# Patient Record
Sex: Female | Born: 1940 | Race: Black or African American | Hispanic: No | Marital: Married | Smoking: Never smoker
Health system: Southern US, Community
[De-identification: ages and names within clinical notes are randomized; demographics above are authoritative.]

## PROBLEM LIST (undated history)

## (undated) DIAGNOSIS — I10 Essential (primary) hypertension: Secondary | ICD-10-CM

## (undated) DIAGNOSIS — E119 Type 2 diabetes mellitus without complications: Secondary | ICD-10-CM

## (undated) DIAGNOSIS — E7800 Pure hypercholesterolemia, unspecified: Secondary | ICD-10-CM

---

## 2018-11-07 ENCOUNTER — Encounter (HOSPITAL_COMMUNITY): Payer: Self-pay | Admitting: Emergency Medicine

## 2018-11-07 ENCOUNTER — Other Ambulatory Visit: Payer: Self-pay

## 2018-11-07 ENCOUNTER — Ambulatory Visit (INDEPENDENT_AMBULATORY_CARE_PROVIDER_SITE_OTHER)
Admission: EM | Admit: 2018-11-07 | Discharge: 2018-11-07 | Disposition: A | Discharge status: Home / Self Care | Payer: Medicare Other | Source: Home / Self Care

## 2018-11-07 ENCOUNTER — Encounter (HOSPITAL_COMMUNITY): Payer: Self-pay | Admitting: Family Medicine

## 2018-11-07 ENCOUNTER — Emergency Department (HOSPITAL_COMMUNITY)
Admission: EM | Admit: 2018-11-07 | Discharge: 2018-11-07 | Disposition: A | Discharge status: Home / Self Care | Payer: Medicare Other | Attending: Emergency Medicine | Admitting: Emergency Medicine

## 2018-11-07 ENCOUNTER — Emergency Department (HOSPITAL_COMMUNITY): Payer: Medicare Other

## 2018-11-07 DIAGNOSIS — R0789 Other chest pain: Secondary | ICD-10-CM | POA: Insufficient documentation

## 2018-11-07 DIAGNOSIS — E119 Type 2 diabetes mellitus without complications: Secondary | ICD-10-CM | POA: Diagnosis not present

## 2018-11-07 DIAGNOSIS — I10 Essential (primary) hypertension: Secondary | ICD-10-CM | POA: Diagnosis not present

## 2018-11-07 DIAGNOSIS — R6889 Other general symptoms and signs: Secondary | ICD-10-CM | POA: Diagnosis not present

## 2018-11-07 DIAGNOSIS — Z7984 Long term (current) use of oral hypoglycemic drugs: Secondary | ICD-10-CM | POA: Insufficient documentation

## 2018-11-07 DIAGNOSIS — R05 Cough: Secondary | ICD-10-CM | POA: Diagnosis not present

## 2018-11-07 DIAGNOSIS — R0602 Shortness of breath: Secondary | ICD-10-CM | POA: Insufficient documentation

## 2018-11-07 DIAGNOSIS — R059 Cough, unspecified: Secondary | ICD-10-CM

## 2018-11-07 DIAGNOSIS — R9431 Abnormal electrocardiogram [ECG] [EKG]: Secondary | ICD-10-CM | POA: Insufficient documentation

## 2018-11-07 HISTORY — DX: Pure hypercholesterolemia, unspecified: E78.00

## 2018-11-07 HISTORY — DX: Type 2 diabetes mellitus without complications: E11.9

## 2018-11-07 HISTORY — DX: Essential (primary) hypertension: I10

## 2018-11-07 LAB — CBC WITH DIFFERENTIAL/PLATELET
Abs Immature Granulocytes: 0.03 10*3/uL (ref 0.00–0.07)
Basophils Absolute: 0 10*3/uL (ref 0.0–0.1)
Basophils Relative: 0 %
Eosinophils Absolute: 0 10*3/uL (ref 0.0–0.5)
Eosinophils Relative: 1 %
HCT: 34.2 % — ABNORMAL LOW (ref 36.0–46.0)
Hemoglobin: 11 g/dL — ABNORMAL LOW (ref 12.0–15.0)
Immature Granulocytes: 1 %
Lymphocytes Relative: 13 %
Lymphs Abs: 0.8 10*3/uL (ref 0.7–4.0)
MCH: 27.9 pg (ref 26.0–34.0)
MCHC: 32.2 g/dL (ref 30.0–36.0)
MCV: 86.8 fL (ref 80.0–100.0)
MONO ABS: 0.6 10*3/uL (ref 0.1–1.0)
Monocytes Relative: 10 %
NEUTROS ABS: 4.4 10*3/uL (ref 1.7–7.7)
Neutrophils Relative %: 75 %
Platelets: 196 10*3/uL (ref 150–400)
RBC: 3.94 MIL/uL (ref 3.87–5.11)
RDW: 13.3 % (ref 11.5–15.5)
WBC: 5.8 10*3/uL (ref 4.0–10.5)
nRBC: 0 % (ref 0.0–0.2)

## 2018-11-07 LAB — BASIC METABOLIC PANEL
Anion gap: 7 (ref 5–15)
BUN: 21 mg/dL (ref 8–23)
CO2: 17 mmol/L — ABNORMAL LOW (ref 22–32)
Calcium: 8.8 mg/dL — ABNORMAL LOW (ref 8.9–10.3)
Chloride: 112 mmol/L — ABNORMAL HIGH (ref 98–111)
Creatinine, Ser: 1.39 mg/dL — ABNORMAL HIGH (ref 0.44–1.00)
GFR calc Af Amer: 42 mL/min — ABNORMAL LOW (ref >60)
GFR, EST NON AFRICAN AMERICAN: 36 mL/min — AB (ref >60)
Glucose, Bld: 99 mg/dL (ref 70–99)
Potassium: 4.1 mmol/L (ref 3.5–5.1)
Sodium: 136 mmol/L (ref 135–145)

## 2018-11-07 LAB — I-STAT TROPONIN, ED: Troponin i, poc: 0.01 ng/mL (ref 0.00–0.08)

## 2018-11-07 MED ORDER — BENZONATATE 100 MG PO CAPS: 100.0000 mg | ORAL_CAPSULE | Freq: Three times a day (TID) | ORAL | 0 refills | Status: DC

## 2018-11-07 MED ORDER — BENZONATATE 100 MG PO CAPS: 100.0000 mg | ORAL_CAPSULE | Freq: Once | ORAL | Status: DC

## 2018-11-07 NOTE — ED Triage Notes (Signed)
Pt reports she was sent over from the [facility]. Pt reports flu-like symptoms since Sunday. Pt afebrile, denies fevers at home. Pt reports cough, chills, and body aches. Pt reports her daughter recently had a cold, denies recent travel.

## 2018-11-07 NOTE — ED Triage Notes (Signed)
C/O deep, dry cough started Sunday.  Patient then developed dizziness.   No dizziness in parking lot or lobby when walking, but legs felt week.  Patient has upper chest soreness with cough

## 2018-11-07 NOTE — ED Provider Notes (Signed)
[facility]    CSN: [id number] Arrival date & time: 11/07/18  1521     History   Chief Complaint Chief Complaint  Patient presents with  . Dizziness  . Cough    HPI Donna Torres is a 78 y.o. female.   This is the initial visit for this 78 year old woman who presents with dizziness. C/O deep, dry cough started Sunday.  Patient then developed dizziness. No dizziness in parking lot or lobby when walking, but legs felt week.  Patient has upper chest soreness with cough. Reports decreased intake of food since yesterday due to loss of appetite but no decrease in fluid intake.      Patient's past medical history is significant for hypertension, chronic kidney disease, and diabetes. She states that she typically checks her blood sugar 1 to 2 times a week (reports that it runs in the 100- 120) but has not checked it since this started.      Past Medical History:  Diagnosis Date  . Diabetes mellitus without complication (HCC)   . High cholesterol   . Hypertension     There are no active problems to display for this patient.   History reviewed. No pertinent surgical history.  OB History   No obstetric history on file.      Home Medications    Prior to Admission medications   Medication Sig Start Date End Date Taking? Authorizing Provider  glipiZIDE (GLUCOTROL) 5 MG tablet Take by mouth daily before breakfast.   Yes [provider]  labetalol (NORMODYNE) 200 MG tablet Take 200 mg by mouth 2 (two) times daily.   Yes [provider]  losartan (COZAAR) 100 MG tablet Take 100 mg by mouth daily.   Yes [provider]  rosuvastatin (CRESTOR) 40 MG tablet Take 40 mg by mouth daily.   Yes [provider]  spironolactone-hydrochlorothiazide (ALDACTAZIDE) 50-50 MG tablet Take 1 tablet by mouth daily.   Yes [provider]    Family History History reviewed. No pertinent family history.  Social History Social History    Tobacco Use  . Smoking status: Never Smoker  Substance Use Topics  . Alcohol use: Never    Frequency: Never  . Drug use: Never     Allergies   Patient has no known allergies.   Review of Systems Review of Systems   Physical Exam Triage Vital Signs ED Triage Vitals  Enc Vitals Group     BP      Pulse      Resp      Temp      Temp src      SpO2      Weight      Height      Head Circumference      Peak Flow      Pain Score      Pain Loc      Pain Edu?      Excl. in GC?    No data found.  Updated Vital Signs BP 132/60 (BP Location: Right Arm)   Pulse 78   Temp 99.8 F (37.7 C) (Oral)   Resp 18   SpO2 96%    Physical Exam Vitals signs and nursing note reviewed.  Constitutional:      Appearance: She is ill-appearing.  HENT:     Head: Normocephalic.     Right Ear: There is impacted cerumen.     Left Ear: Tympanic membrane normal.     Nose: Nose  normal.     Mouth/Throat:     Mouth: Mucous membranes are moist.     Pharynx: No oropharyngeal exudate or posterior oropharyngeal erythema.  Eyes:     Conjunctiva/sclera: Conjunctivae normal.     Pupils: Pupils are equal, round, and reactive to light.  Neck:     Musculoskeletal: Neck supple.  Cardiovascular:     Rate and Rhythm: Normal rate and regular rhythm.     Pulses: Normal pulses.     Heart sounds: Normal heart sounds.  Pulmonary:     Effort: Respiratory distress present.     Breath sounds: Rhonchi present.  Abdominal:     Palpations: Abdomen is soft.  Skin:    General: Skin is warm and dry.  Neurological:     General: No focal deficit present.     Mental Status: She is alert and oriented to person, place, and time.     Sensory: Sensation is intact.     Motor: Motor function is intact.     Coordination: Coordination is intact.     Gait: Gait abnormal.     Comments: Ambulated patient ~ 6 feet with pulse ox on finger and patient became dizzy and had to grab onto counter.   Psychiatric:         Mood and Affect: Mood normal.        Behavior: Behavior normal.        Thought Content: Thought content normal.        Judgment: Judgment normal.      UC Treatments / Results  Labs (all labs ordered are listed, but only abnormal results are displayed) Labs Reviewed  POCT INFLUENZA A/B  CBG MONITORING, ED    EKG None  Radiology No results found.  Procedures Procedures (including critical care time)  Medications Ordered in UC Medications - No data to display  Initial Impression / Assessment and Plan / UC Course  I have reviewed the triage vital signs and the nursing notes.  Pertinent labs & imaging results that were available during my care of the patient were reviewed by me and considered in my medical decision making (see chart for details).    Final Clinical Impressions(s) / UC Diagnoses   Final diagnoses:  None   Discharge Instructions   None    ED Prescriptions    None     Controlled Substance Prescriptions  Controlled Substance Registry consulted? Not Applicable   Elvina Sidle, MD 11/07/18 [phone]

## 2018-11-07 NOTE — Discharge Instructions (Signed)
There are several possible explanations for your acute illness.  We need to find out what is causing this and we do not have all the testing necessary here at the urgent care.  You need to go immediately to the emergency room and have them evaluate you.

## 2018-11-07 NOTE — Discharge Instructions (Signed)
Please read and follow all provided instructions.  Your diagnoses today include:  1. Cough     Tests performed today include:  Chest x-ray - does not show any pneumonia  Blood counts and electrolytes - slightly low red blood cell count and slightly low kidney function, please have your doctor monitor your levels  Vital signs. See below for your results today.   Medications prescribed:   Tessalon Perles - cough suppressant medication  Take any prescribed medications only as directed.  Home care instructions:  Follow any educational materials contained in this packet.  Follow-up instructions: Please follow-up with your primary care provider in the next 3 days for further evaluation of your symptoms and a recheck if you are not feeling better.   Return instructions:   Please return to the Emergency Department if you experience worsening symptoms.  Please return with worsening wheezing, shortness of breath, or difficulty breathing.  Return with persistent fever above 101F.   Please return if you have any other emergent concerns.  Additional Information:  Your vital signs today were: BP 133/80    Pulse 76    Temp 99.3 F (37.4 C) (Oral) Comment: Simultaneous filing. User may not have seen previous data. Comment (Src): Simultaneous filing. User may not have seen previous data.   Resp 15    Ht 5\' 6"  (1.676 m)    Wt 82.1 kg    SpO2 97%    BMI 29.21 kg/m  If your blood pressure (BP) was elevated above 135/85 this visit, please have this repeated by your doctor within one month. --------------

## 2018-11-07 NOTE — ED Provider Notes (Signed)
[facility] EMERGENCY DEPARTMENT Provider Note   CSN: [id number] Arrival date & time: 11/07/18  1704    History   Chief Complaint Chief Complaint  Patient presents with  . Influenza    HPI Donna Torres is a 78 y.o. female.     Patient with history of diabetes, high cholesterol, hypertension presents the emergency department from urgent care with complaint of cough, chest pain, shortness of breath with exertion.  Patient states that she suddenly developed symptoms on Sunday (today is Wednesday).  She reports a deep, nonproductive cough with pain in her chest when she coughs.  She has not had any documented fevers at home.  No ear pain, runny nose, or sore throat.  No body aches or chills.  She feels more short of breath when she walks up a flight of stairs or is more active.  She denies any nausea, vomiting, or diarrhea.  No diaphoresis, lightheadedness or presyncope.  No lower extremity swelling.  No recent travel to high risk areas for COVID-19 or any positive contacts to those with a virus.     Past Medical History:  Diagnosis Date  . Diabetes mellitus without complication (HCC)   . High cholesterol   . Hypertension     There are no active problems to display for this patient.   No past surgical history on file.   OB History   No obstetric history on file.      Home Medications    Prior to Admission medications   Medication Sig Start Date End Date Taking? Authorizing Provider  glipiZIDE (GLUCOTROL) 5 MG tablet Take by mouth daily before breakfast.    [provider]  labetalol (NORMODYNE) 200 MG tablet Take 200 mg by mouth 2 (two) times daily.    [provider]  losartan (COZAAR) 100 MG tablet Take 100 mg by mouth daily.    [provider]  rosuvastatin (CRESTOR) 40 MG tablet Take 40 mg by mouth daily.    [provider]  spironolactone-hydrochlorothiazide (ALDACTAZIDE) 50-50 MG tablet Take 1 tablet by mouth  daily.    [provider]    Family History No family history on file.  Social History Social History   Tobacco Use  . Smoking status: Never Smoker  . Smokeless tobacco: Never Used  Substance Use Topics  . Alcohol use: Never    Frequency: Never  . Drug use: Never     Allergies   Patient has no known allergies.   Review of Systems Review of Systems  Constitutional: Negative for fever.  HENT: Negative for rhinorrhea and sore throat.   Eyes: Negative for redness.  Respiratory: Positive for cough and shortness of breath. Negative for wheezing.   Cardiovascular: Positive for chest pain.  Gastrointestinal: Negative for abdominal pain, diarrhea, nausea and vomiting.  Genitourinary: Negative for dysuria.  Musculoskeletal: Negative for myalgias.  Skin: Negative for rash.  Neurological: Negative for headaches.     Physical Exam Updated Vital Signs BP (!) 143/65 (BP Location: Right Arm)   Pulse 82   Temp 99.3 F (37.4 C) (Oral) Comment: Simultaneous filing. User may not have seen previous data. Comment (Src): Simultaneous filing. User may not have seen previous data.  Resp 20   Ht 5\' 6"  (1.676 m)   Wt 82.1 kg   SpO2 97%   BMI 29.21 kg/m   Physical Exam Vitals signs and nursing note reviewed.  Constitutional:      Appearance: She is well-developed.  HENT:  Head: Normocephalic and atraumatic.     Jaw: No trismus.     Right Ear: Tympanic membrane, ear canal and external ear normal.     Left Ear: Tympanic membrane, ear canal and external ear normal.     Nose: Nose normal. No mucosal edema or rhinorrhea.     Mouth/Throat:     Mouth: Mucous membranes are not dry. No oral lesions.     Pharynx: Uvula midline. No oropharyngeal exudate, posterior oropharyngeal erythema or uvula swelling.     Tonsils: No tonsillar abscesses.  Eyes:     General:        Right eye: No discharge.        Left eye: No discharge.     Conjunctiva/sclera: Conjunctivae normal.  Neck:      Musculoskeletal: Normal range of motion and neck supple.  Cardiovascular:     Rate and Rhythm: Normal rate and regular rhythm.     Heart sounds: Normal heart sounds.  Pulmonary:     Effort: Pulmonary effort is normal. No respiratory distress.     Breath sounds: Rhonchi (Scattered) present. No wheezing or rales.  Abdominal:     Palpations: Abdomen is soft.     Tenderness: There is no abdominal tenderness.  Lymphadenopathy:     Cervical: No cervical adenopathy.  Skin:    General: Skin is warm and dry.  Neurological:     Mental Status: She is alert.      ED Treatments / Results  Labs (all labs ordered are listed, but only abnormal results are displayed) Labs Reviewed  CBC WITH DIFFERENTIAL/PLATELET - Abnormal; Notable for the following components:      Result Value   Hemoglobin 11.0 (*)    HCT 34.2 (*)    All other components within normal limits  BASIC METABOLIC PANEL - Abnormal; Notable for the following components:   Chloride 112 (*)    CO2 17 (*)    Creatinine, Ser 1.39 (*)    Calcium 8.8 (*)    GFR calc non Af Amer 36 (*)    GFR calc Af Amer 42 (*)    All other components within normal limits  I-STAT TROPONIN, ED    EKG EKG Interpretation  Date/Time:  Wednesday November 07 2018 18:36:50 EDT Ventricular Rate:  73 PR Interval:    QRS Duration: 128 QT Interval:  434 QTC Calculation: 479 R Axis:   75 Text Interpretation:  Sinus rhythm Right bundle branch block Abnormal T, consider ischemia, lateral leads Baseline wander in lead(s) V2 V6 Confirmed by Kristine Royal [phone]) on 11/07/2018 6:53:51 PM   Radiology Dg Chest 2 View  Result Date: 11/07/2018 CLINICAL DATA:  Chest pain and cough for 3 days, dry cough, chills, body aches, mid chest pain when coughing deeply, hypertension, type II diabetes mellitus EXAM: CHEST - 2 VIEW COMPARISON:  None FINDINGS: Upper normal heart size. Mediastinal contours and pulmonary vascularity normal. Lungs clear. No pleural effusion  or pneumothorax. Bones unremarkable. IMPRESSION: No acute abnormalities. Electronically Signed   By: Ulyses Southward M.D.   On: 11/07/2018 18:30    Procedures Procedures (including critical care time)  Medications Ordered in ED Medications  benzonatate (TESSALON) capsule 100 mg (has no administration in time range)     Initial Impression / Assessment and Plan / ED Course  I have reviewed the triage vital signs and the nursing notes.  Pertinent labs & imaging results that were available during my care of the patient were reviewed by me and  considered in my medical decision making (see chart for details).        Patient seen and examined.  Labs, EKG, x-ray ordered.  Overall patient appears well.  Occasional cough during exam.  She is wearing a droplet mask.  Vital signs reviewed and are as follows: BP 133/80   Pulse 76   Temp 99.3 F (37.4 C) (Oral) Comment: Simultaneous filing. User may not have seen previous data. Comment (Src): Simultaneous filing. User may not have seen previous data.  Resp 15   Ht 5\' 6"  (1.676 m)   Wt 82.1 kg   SpO2 97%   BMI 29.21 kg/m   7:30 PM patient discussed with and seen by Dr. Rodena Medin.  Lab work and x-ray are reassuring.  Patient home with Tessalon for cough.  Discussed self-quarantine with patient until symptoms improved.  Encouraged return or call PCP with worsening chest pain, shortness of breath, fever, new symptoms or other concerns.  Final Clinical Impressions(s) / ED Diagnoses   Final diagnoses:  Cough   Patient with several days of cough, chest pain related to coughing.  Lab work-up here including blood counts, electrolytes, troponin are reassuring.  Chest x-ray without signs of pneumonia.  Patient is afebrile and has not had any documented fevers.  No cold contacts.  She has normal vital signs and is well-appearing speaking in full sentences in no distress.  Patient discussed with and seen by attending physician.  Cleared for discharged  home at this time with continued symptomatic care and isolation.  EKG with diffuse abnormal t-waves. Pt without persistent CP, sx ongoing for 3 days, troponin 0.01. Doubt ACS.   ED Discharge Orders         Ordered    benzonatate (TESSALON) 100 MG capsule  Every 8 hours     11/07/18 1916           Renne Crigler, Cordelia Poche 11/07/18 1935    Wynetta Fines, MD 11/12/18 [phone]

## 2018-11-07 NOTE — ED Notes (Signed)
Patient transported to X-ray 

## 2019-05-27 IMAGING — DX CHEST - 2 VIEW
2 series · 2 of 2 positions shown · non-contrast
Comparison: None

CLINICAL DATA: Chest pain and cough for 3 days, dry cough, chills,
body aches, mid chest pain when coughing deeply, hypertension, type
II diabetes mellitus

EXAM:
CHEST - 2 VIEW

[chest pa]
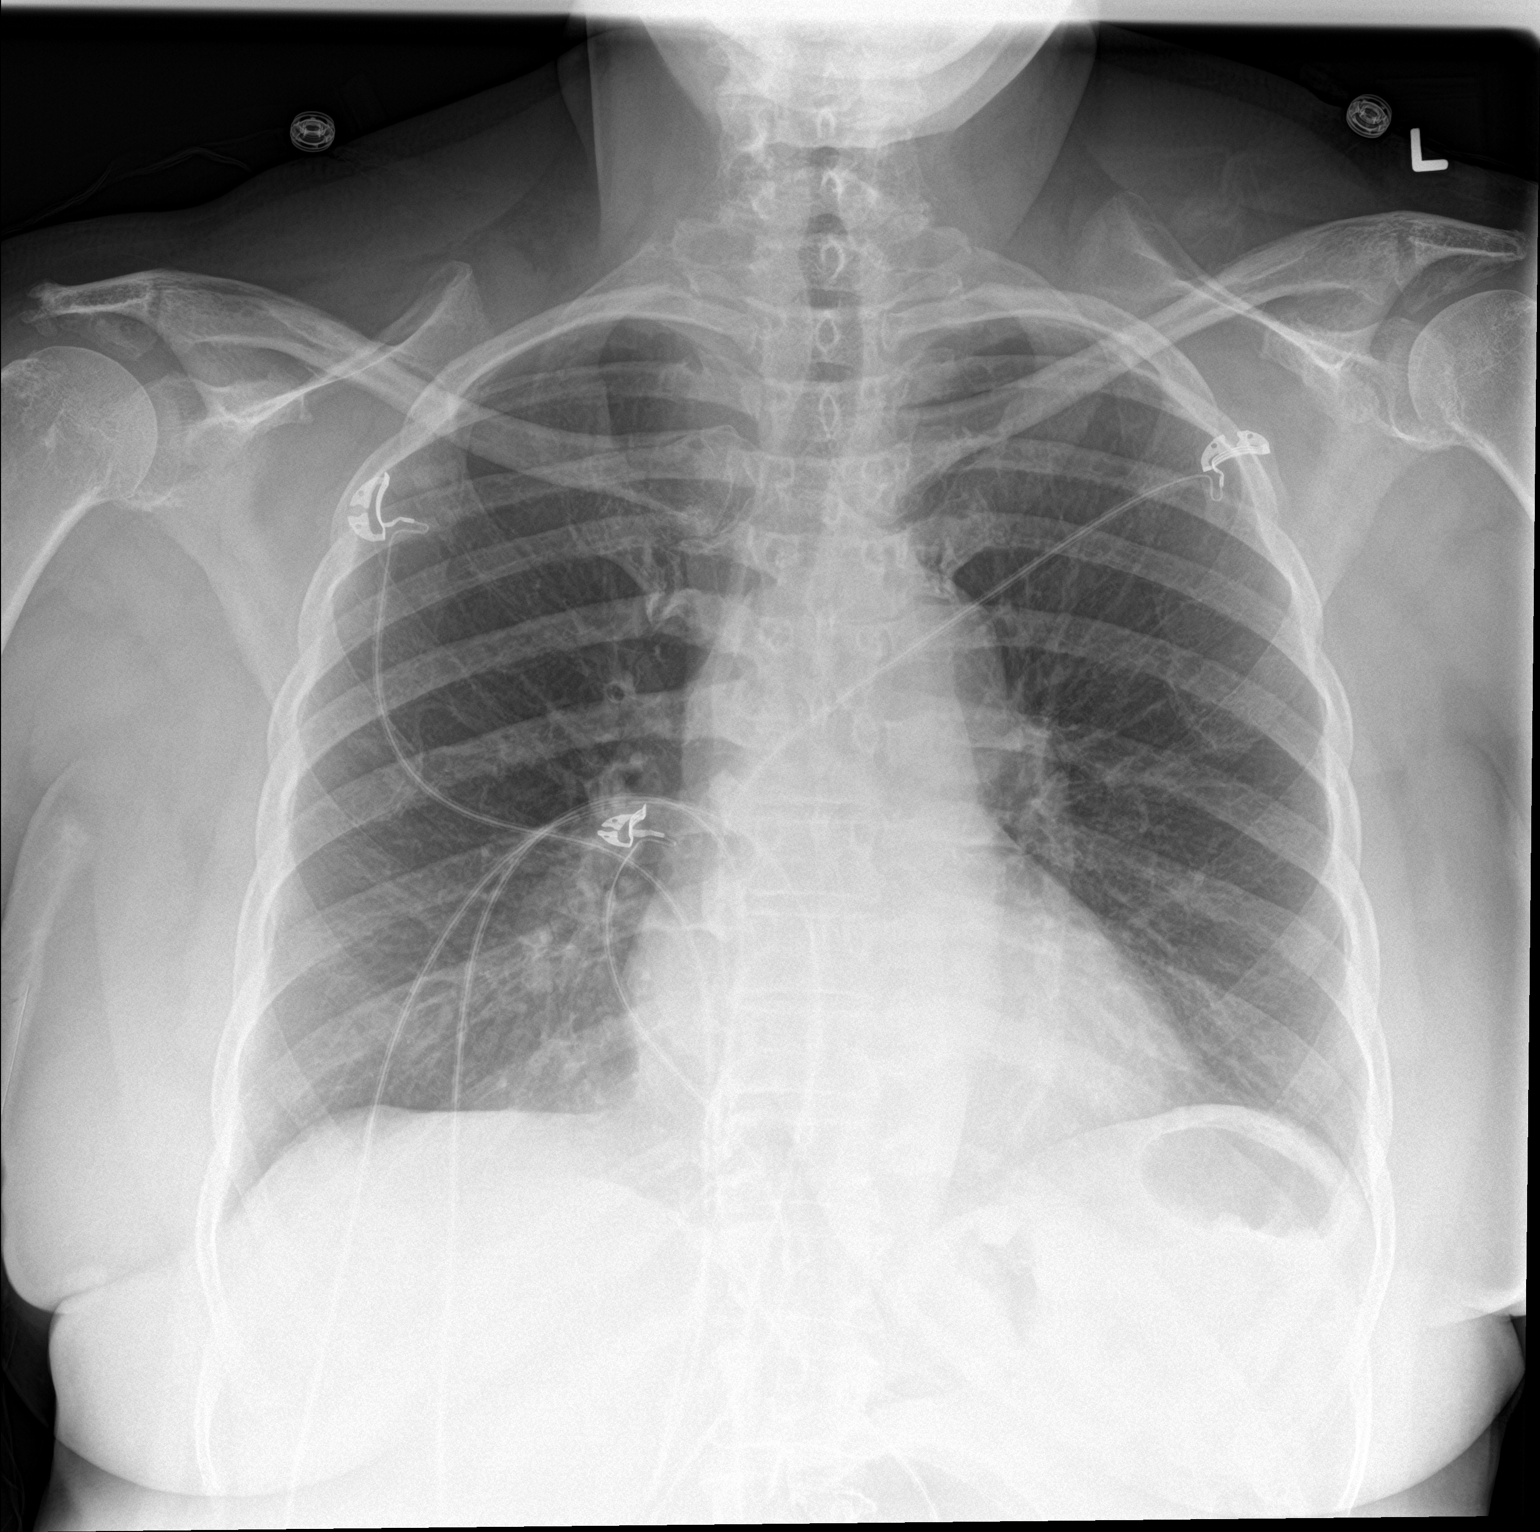

[chest lat]
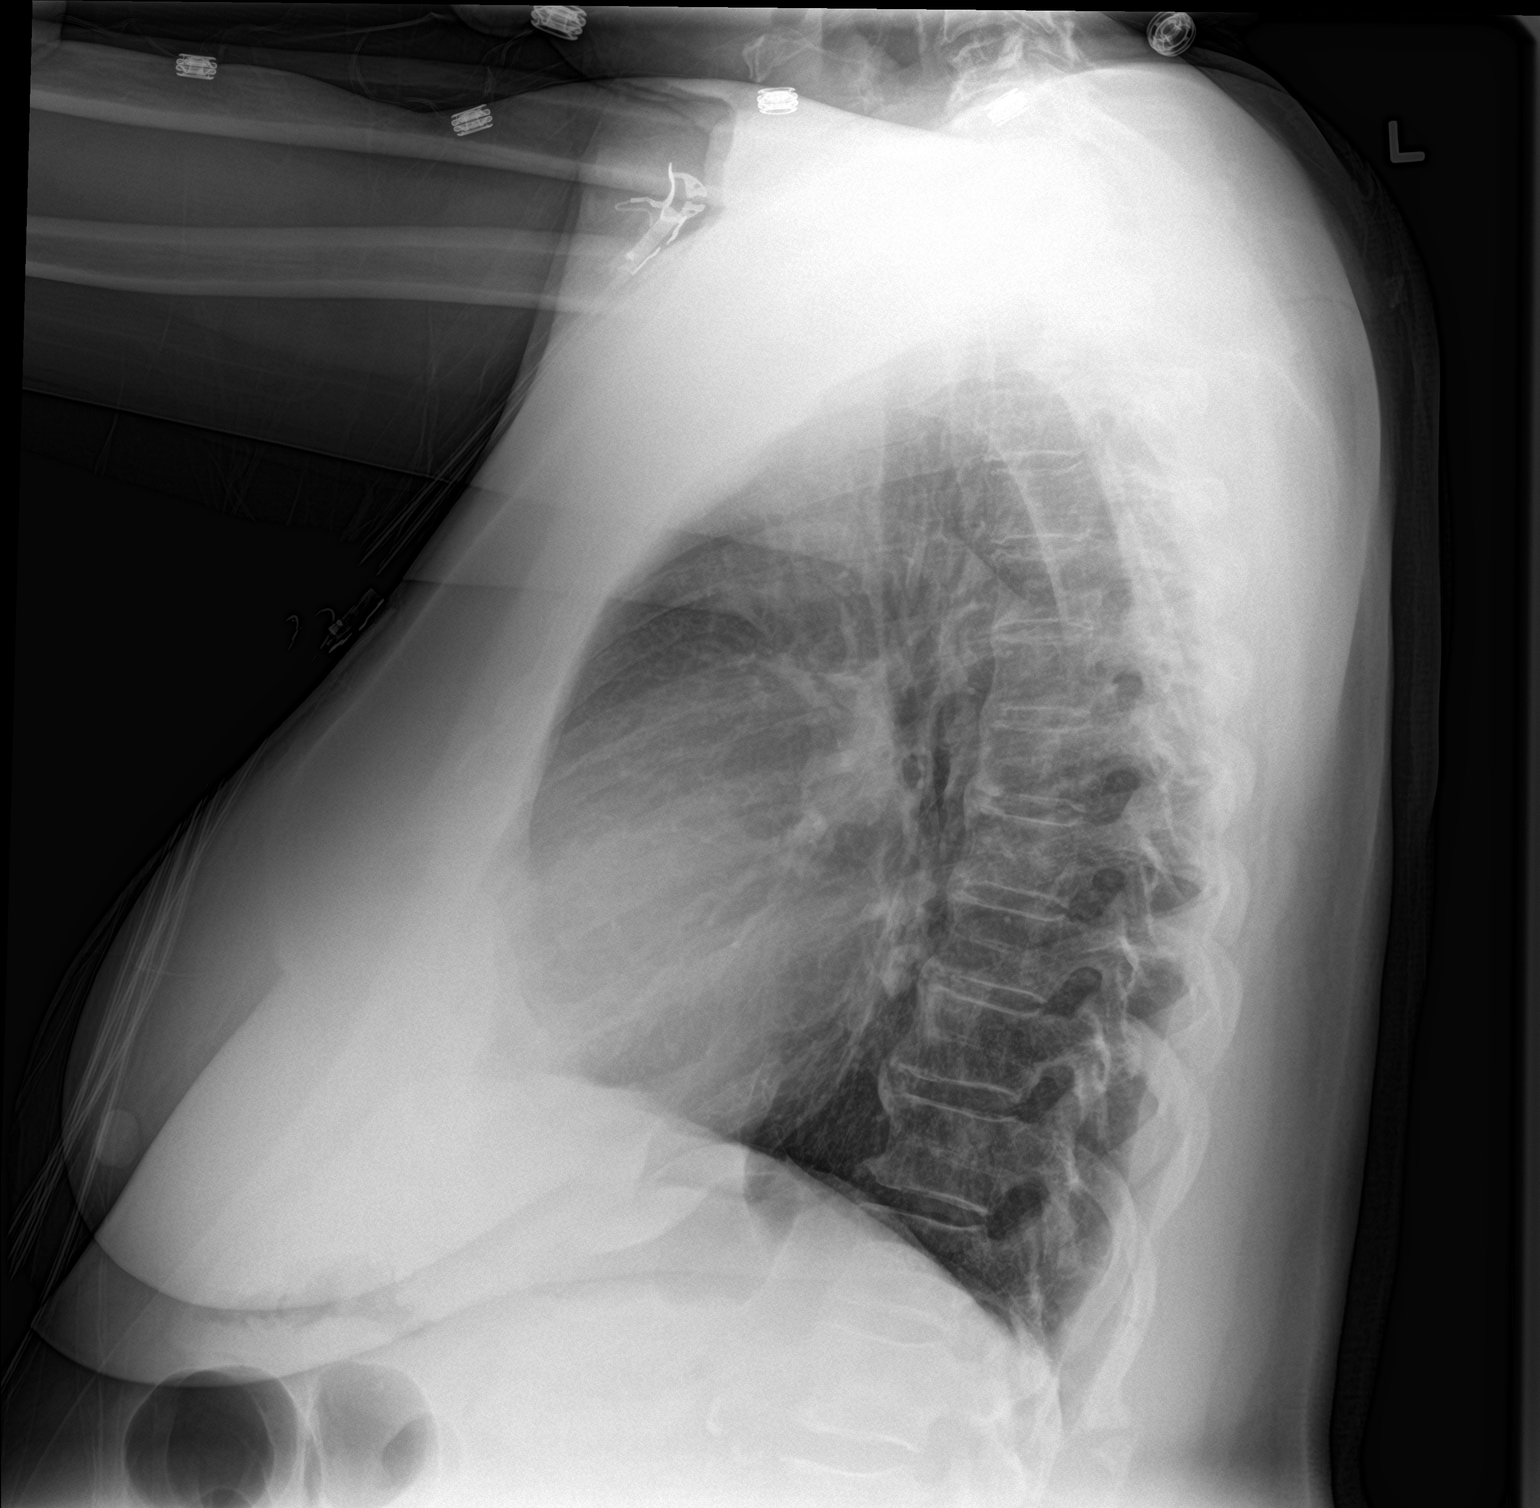

[2 of 2 positions shown; findings below may reference images not displayed]

FINDINGS: Upper normal heart size.

Mediastinal contours and pulmonary vascularity normal.

Lungs clear.

No pleural effusion or pneumothorax.

Bones unremarkable.
IMPRESSION: No acute abnormalities.

## 2019-08-13 ENCOUNTER — Other Ambulatory Visit: Payer: Self-pay

## 2019-08-13 ENCOUNTER — Encounter (HOSPITAL_COMMUNITY): Payer: Self-pay

## 2019-08-13 ENCOUNTER — Ambulatory Visit (HOSPITAL_COMMUNITY)
Admission: EM | Admit: 2019-08-13 | Discharge: 2019-08-13 | Disposition: A | Discharge status: Home / Self Care | Payer: Medicare Other | Attending: Family Medicine | Admitting: Family Medicine

## 2019-08-13 DIAGNOSIS — R0981 Nasal congestion: Secondary | ICD-10-CM | POA: Diagnosis not present

## 2019-08-13 DIAGNOSIS — Z79899 Other long term (current) drug therapy: Secondary | ICD-10-CM | POA: Diagnosis not present

## 2019-08-13 DIAGNOSIS — Z8042 Family history of malignant neoplasm of prostate: Secondary | ICD-10-CM | POA: Diagnosis not present

## 2019-08-13 DIAGNOSIS — Z809 Family history of malignant neoplasm, unspecified: Secondary | ICD-10-CM | POA: Insufficient documentation

## 2019-08-13 DIAGNOSIS — Z20828 Contact with and (suspected) exposure to other viral communicable diseases: Secondary | ICD-10-CM | POA: Diagnosis not present

## 2019-08-13 DIAGNOSIS — M791 Myalgia, unspecified site: Secondary | ICD-10-CM | POA: Diagnosis not present

## 2019-08-13 DIAGNOSIS — E7800 Pure hypercholesterolemia, unspecified: Secondary | ICD-10-CM | POA: Insufficient documentation

## 2019-08-13 DIAGNOSIS — E119 Type 2 diabetes mellitus without complications: Secondary | ICD-10-CM | POA: Insufficient documentation

## 2019-08-13 DIAGNOSIS — R05 Cough: Secondary | ICD-10-CM

## 2019-08-13 DIAGNOSIS — Z7984 Long term (current) use of oral hypoglycemic drugs: Secondary | ICD-10-CM | POA: Insufficient documentation

## 2019-08-13 DIAGNOSIS — I10 Essential (primary) hypertension: Secondary | ICD-10-CM | POA: Diagnosis not present

## 2019-08-13 DIAGNOSIS — B349 Viral infection, unspecified: Secondary | ICD-10-CM | POA: Insufficient documentation

## 2019-08-13 MED ORDER — BENZONATATE 100 MG PO CAPS: 100.0000 mg | ORAL_CAPSULE | Freq: Three times a day (TID) | ORAL | 0 refills | Status: DC

## 2019-08-13 MED ORDER — CETIRIZINE HCL 10 MG PO TABS: 10.0000 mg | ORAL_TABLET | Freq: Every day | ORAL | 0 refills | Status: DC

## 2019-08-13 NOTE — ED Provider Notes (Signed)
[facility]    CSN: [id number] Arrival date & time: 08/13/19  1009      History   Chief Complaint Chief Complaint  Patient presents with  . Cough    HPI Donna Torres is a 78 y.o. female.   Patient is a 78 year old female with past medical history of diabetes, high cholesterol, hypertension the presents today with cough, nasal congestion, rhinorrhea.  Symptoms of been constant for the past 2 days.  Reports the cough is dry.  She has been taking Mucinex and Tylenol without much relief.  Has had some body aches and hot flashes at nighttime.  Unknown if she has had a fever.  No chest pain or shortness of breath.  No recent sick contacts or recent traveling.  Has had decrease in appetite due to food not tasting well.  ROS per HPI    Cough   Past Medical History:  Diagnosis Date  . Diabetes mellitus without complication (HCC)   . High cholesterol   . Hypertension     There are no problems to display for this patient.   History reviewed. No pertinent surgical history.  OB History   No obstetric history on file.      Home Medications    Prior to Admission medications   Medication Sig Start Date End Date Taking? Authorizing Provider  benzonatate (TESSALON) 100 MG capsule Take 1 capsule (100 mg total) by mouth every 8 (eight) hours. 08/13/19   Dahlia Byes A, NP  cetirizine (ZYRTEC) 10 MG tablet Take 1 tablet (10 mg total) by mouth daily. 08/13/19   Cordarius Benning, Gloris Manchester A, NP  glipiZIDE (GLUCOTROL) 5 MG tablet Take by mouth daily before breakfast.    [provider]  labetalol (NORMODYNE) 200 MG tablet Take 200 mg by mouth 2 (two) times daily.    [provider]  losartan (COZAAR) 100 MG tablet Take 100 mg by mouth daily.    [provider]  rosuvastatin (CRESTOR) 40 MG tablet Take 40 mg by mouth daily.    [provider]  spironolactone-hydrochlorothiazide (ALDACTAZIDE) 50-50 MG tablet Take 1 tablet by mouth daily.    [provider]    Family History Family History  Problem Relation Age of Onset  . Cancer Mother   . Prostate cancer Father     Social History Social History   Tobacco Use  . Smoking status: Never Smoker  . Smokeless tobacco: Never Used  Substance Use Topics  . Alcohol use: Never  . Drug use: Never     Allergies   Patient has no known allergies.   Review of Systems Review of Systems  Respiratory: Positive for cough.      Physical Exam Triage Vital Signs ED Triage Vitals  Enc Vitals Group     BP 08/13/19 1032 (!) 158/75     Pulse Rate 08/13/19 1032 71     Resp 08/13/19 1032 18     Temp 08/13/19 1032 99 F (37.2 C)     Temp Source 08/13/19 1032 Oral     SpO2 08/13/19 1032 97 %     Weight 08/13/19 1029 182 lb (82.6 kg)     Height --      Head Circumference --      Peak Flow --      Pain Score 08/13/19 1029 0     Pain Loc --      Pain Edu? --      Excl. in GC? --  No data found.  Updated Vital Signs BP (!) 158/75 (BP Location: Right Arm)   Pulse 71   Temp 99 F (37.2 C) (Oral)   Resp 18   Wt 182 lb (82.6 kg)   SpO2 97%   BMI 29.38 kg/m   Visual Acuity Right Eye Distance:   Left Eye Distance:   Bilateral Distance:    Right Eye Near:   Left Eye Near:    Bilateral Near:     Physical Exam Constitutional:      General: She is not in acute distress.    Appearance: Normal appearance. She is not ill-appearing, toxic-appearing or diaphoretic.  HENT:     Head: Normocephalic and atraumatic.     Right Ear: Tympanic membrane and ear canal normal.     Left Ear: Tympanic membrane and ear canal normal.     Nose: Congestion and rhinorrhea present.     Mouth/Throat:     Pharynx: Oropharynx is clear.  Eyes:     Conjunctiva/sclera: Conjunctivae normal.  Cardiovascular:     Rate and Rhythm: Normal rate and regular rhythm.  Pulmonary:     Effort: Pulmonary effort is normal.     Breath sounds: Normal breath sounds.  Musculoskeletal:        General:  Normal range of motion.     Cervical back: Normal range of motion.  Skin:    General: Skin is warm and dry.  Neurological:     Mental Status: She is alert.  Psychiatric:        Mood and Affect: Mood normal.      UC Treatments / Results  Labs (all labs ordered are listed, but only abnormal results are displayed) Labs Reviewed  NOVEL CORONAVIRUS, NAA (HOSP ORDER, SEND-OUT TO REF LAB; TAT 18-24 HRS)    EKG   Radiology No results found.  Procedures Procedures (including critical care time)  Medications Ordered in UC Medications - No data to display  Initial Impression / Assessment and Plan / UC Course  I have reviewed the triage vital signs and the nursing notes.  Pertinent labs & imaging results that were available during my care of the patient were reviewed by me and considered in my medical decision making (see chart for details).     Viral illness-symptoms of Covid.  Covid swab sent for testing with labs pending. Precautions given Tessalon Perles for cough and Zyrtec for nasal congestion and rhinorrhea Follow up as needed for continued or worsening symptoms  Final Clinical Impressions(s) / UC Diagnoses   Final diagnoses:  Viral illness     Discharge Instructions     Medication as prescribed for symptoms.  We will call you if your COVID test is positive Take precautions and quarantine until then   Follow up as needed for continued or worsening symptoms     ED Prescriptions    Medication Sig Dispense Auth. Provider   benzonatate (TESSALON) 100 MG capsule Take 1 capsule (100 mg total) by mouth every 8 (eight) hours. 15 capsule Kenyon Eshleman A, NP   cetirizine (ZYRTEC) 10 MG tablet Take 1 tablet (10 mg total) by mouth daily. 30 tablet Loura Halt A, NP     PDMP not reviewed this encounter.   Orvan July, NP 08/13/19 1058

## 2019-08-13 NOTE — Discharge Instructions (Addendum)
Medication as prescribed for symptoms.  We will call you if your COVID test is positive Take precautions and quarantine until then   Follow up as needed for continued or worsening symptoms

## 2019-08-13 NOTE — ED Triage Notes (Signed)
Pt. States she feels congestion/cough that started yesterday.

## 2019-08-15 ENCOUNTER — Telehealth: Payer: Self-pay | Admitting: General Practice

## 2019-08-15 LAB — NOVEL CORONAVIRUS, NAA (HOSP ORDER, SEND-OUT TO REF LAB; TAT 18-24 HRS): SARS-CoV-2, NAA: NOT DETECTED

## 2019-08-15 NOTE — Telephone Encounter (Signed)
Negative COVID results given. Patient results "NOT Detected." Caller expressed understanding. ° °

## 2021-01-01 ENCOUNTER — Ambulatory Visit (HOSPITAL_COMMUNITY)
Admission: EM | Admit: 2021-01-01 | Discharge: 2021-01-01 | Disposition: A | Discharge status: Home / Self Care | Payer: Medicare Other | Attending: Medical Oncology | Admitting: Medical Oncology

## 2021-01-01 ENCOUNTER — Ambulatory Visit (INDEPENDENT_AMBULATORY_CARE_PROVIDER_SITE_OTHER): Payer: Medicare Other

## 2021-01-01 ENCOUNTER — Encounter (HOSPITAL_COMMUNITY): Payer: Self-pay | Admitting: Emergency Medicine

## 2021-01-01 DIAGNOSIS — Z7984 Long term (current) use of oral hypoglycemic drugs: Secondary | ICD-10-CM | POA: Diagnosis not present

## 2021-01-01 DIAGNOSIS — U071 COVID-19: Secondary | ICD-10-CM | POA: Insufficient documentation

## 2021-01-01 DIAGNOSIS — R519 Headache, unspecified: Secondary | ICD-10-CM

## 2021-01-01 DIAGNOSIS — R509 Fever, unspecified: Secondary | ICD-10-CM | POA: Diagnosis not present

## 2021-01-01 DIAGNOSIS — R059 Cough, unspecified: Secondary | ICD-10-CM

## 2021-01-01 DIAGNOSIS — Z79899 Other long term (current) drug therapy: Secondary | ICD-10-CM | POA: Diagnosis not present

## 2021-01-01 MED ORDER — FLUTICASONE PROPIONATE 50 MCG/ACT NA SUSP: 2.0000 | Freq: Every day | NASAL | 0 refills | Status: DC

## 2021-01-01 MED ORDER — BENZONATATE 200 MG PO CAPS: 200.0000 mg | ORAL_CAPSULE | Freq: Three times a day (TID) | ORAL | 0 refills | Status: DC | PRN

## 2021-01-01 MED ORDER — ACETAMINOPHEN 325 MG PO TABS
650.0000 mg | ORAL_TABLET | Freq: Once | ORAL | Status: AC
  Administered 2021-01-01: 650 mg via ORAL

## 2021-01-01 MED ORDER — ACETAMINOPHEN 325 MG PO TABS
ORAL_TABLET | ORAL | Status: AC
  Filled 2021-01-01: qty 2

## 2021-01-01 NOTE — ED Provider Notes (Signed)
[facility]    CSN: [id number] Arrival date & time: 01/01/21  9937      History   Chief Complaint Chief Complaint  Patient presents with  . Cough  . Headache    HPI Donna Torres is a 80 y.o. female.   HPI  Cough: Pt reports that she went and voted on Tuesday. She then came down with cough, headache and fever. She has been using tylenol and mucinex for symptoms with some improvement. She denies chest pains, SOB, wheezing, N/V/D.    Past Medical History:  Diagnosis Date  . Diabetes mellitus without complication (HCC)   . High cholesterol   . Hypertension     There are no problems to display for this patient.   History reviewed. No pertinent surgical history.  OB History   No obstetric history on file.      Home Medications    Prior to Admission medications   Medication Sig Start Date End Date Taking? Authorizing Provider  benzonatate (TESSALON) 100 MG capsule Take 1 capsule (100 mg total) by mouth every 8 (eight) hours. 08/13/19   Dahlia Byes A, NP  cetirizine (ZYRTEC) 10 MG tablet Take 1 tablet (10 mg total) by mouth daily. 08/13/19   Bast, Gloris Manchester A, NP  glipiZIDE (GLUCOTROL) 5 MG tablet Take by mouth daily before breakfast.    [provider]  labetalol (NORMODYNE) 200 MG tablet Take 200 mg by mouth 2 (two) times daily.    [provider]  losartan (COZAAR) 100 MG tablet Take 100 mg by mouth daily.    [provider]  rosuvastatin (CRESTOR) 40 MG tablet Take 40 mg by mouth daily.    [provider]  spironolactone-hydrochlorothiazide (ALDACTAZIDE) 50-50 MG tablet Take 1 tablet by mouth daily.    [provider]    Family History Family History  Problem Relation Age of Onset  . Cancer Mother   . Prostate cancer Father     Social History Social History   Tobacco Use  . Smoking status: Never Smoker  . Smokeless tobacco: Never Used  Vaping Use  . Vaping Use: Never used  Substance Use Topics  .  Alcohol use: Never  . Drug use: Never     Allergies   Patient has no known allergies.   Review of Systems Review of Systems  As stated above in HPI Physical Exam Triage Vital Signs ED Triage Vitals  Enc Vitals Group     BP 01/01/21 1050 (!) 156/65     Pulse Rate 01/01/21 1050 87     Resp 01/01/21 1050 17     Temp 01/01/21 1050 100.2 F (37.9 C)     Temp Source 01/01/21 1050 Oral     SpO2 01/01/21 1050 98 %     Weight --      Height --      Head Circumference --      Peak Flow --      Pain Score 01/01/21 1047 5     Pain Loc --      Pain Edu? --      Excl. in GC? --    No data found.  Updated Vital Signs BP (!) 156/65 (BP Location: Right Arm)   Pulse 87   Temp 100.2 F (37.9 C) (Oral)   Resp 17   SpO2 98%   Physical Exam Vitals and nursing note reviewed.  Constitutional:      Appearance: She is well-developed.  HENT:  Head: Normocephalic and atraumatic.     Mouth/Throat:     Mouth: Mucous membranes are moist.     Pharynx: Oropharynx is clear.  Eyes:     Extraocular Movements: Extraocular movements intact.     Pupils: Pupils are equal, round, and reactive to light.  Cardiovascular:     Rate and Rhythm: Normal rate and regular rhythm.     Heart sounds: Normal heart sounds.  Pulmonary:     Effort: Pulmonary effort is normal.     Breath sounds: Normal breath sounds.  Musculoskeletal:     Cervical back: Normal range of motion and neck supple.  Lymphadenopathy:     Cervical: No cervical adenopathy.  Skin:    General: Skin is warm.  Neurological:     Mental Status: She is alert and oriented to person, place, and time.     Motor: No weakness.  Psychiatric:        Mood and Affect: Mood is not depressed.        Cognition and Memory: Cognition is not impaired. Memory is not impaired.      UC Treatments / Results  Labs (all labs ordered are listed, but only abnormal results are displayed) Labs Reviewed  SARS CORONAVIRUS 2 (TAT 6-24 HRS)     EKG   Radiology No results found.  Procedures Procedures (including critical care time)  Medications Ordered in UC Medications  acetaminophen (TYLENOL) tablet 650 mg (has no administration in time range)    Initial Impression / Assessment and Plan / UC Course  I have reviewed the triage vital signs and the nursing notes.  Pertinent labs & imaging results that were available during my care of the patient were reviewed by me and considered in my medical decision making (see chart for details).    New. Given age, cough and fever chest x ray was obtained which did not show acute abnormality. Treating with tessalon and flonase. Discussed including red flag signs and symptoms.    Final Clinical Impressions(s) / UC Diagnoses   Final diagnoses:  None   Discharge Instructions   None    ED Prescriptions    None     PDMP not reviewed this encounter.   Rushie Chestnut, [place] 01/01/21 1151

## 2021-01-01 NOTE — ED Triage Notes (Signed)
Pt presents with cough, headache, and fever xs 3 days. States has been taking tylenol and mucinex. States last dose of tylenol was last pm.

## 2021-01-02 LAB — SARS CORONAVIRUS 2 (TAT 6-24 HRS): SARS Coronavirus 2: POSITIVE — AB

## 2021-01-03 ENCOUNTER — Telehealth: Payer: Self-pay | Admitting: Oncology

## 2021-01-03 MED ORDER — MOLNUPIRAVIR EUA 200MG CAPSULE: 4.0000 | ORAL_CAPSULE | Freq: Two times a day (BID) | ORAL | 0 refills | Status: AC

## 2021-01-03 NOTE — Telephone Encounter (Signed)
Outpatient Oral COVID Treatment Note  I connected with Donna Torres on 01/03/2021/12:21 PM by telephone and verified that I am speaking with the correct person using two identifiers.  I discussed the limitations, risks, security, and privacy concerns of performing an evaluation and management service by telephone and the availability of in person appointments. I also discussed with the patient that there may be a patient responsible charge related to this service. The patient expressed understanding and agreed to proceed.  Patient location: Home Provider location: Clinic   Diagnosis: COVID-19 infection  Purpose of visit: Discussion of potential use of Molnupiravir or Paxlovid, a new treatment for mild to moderate COVID-19 viral infection in non-hospitalized patients.   Subjective: Patient is a 80 y.o. female who has been diagnosed with COVID 19 viral infection.   Past Medical History:  Diagnosis Date  . Diabetes mellitus without complication (HCC)   . High cholesterol   . Hypertension     No Known Allergies   Current Outpatient Medications:  .  benzonatate (TESSALON) 200 MG capsule, Take 1 capsule (200 mg total) by mouth 3 (three) times daily as needed for cough., Disp: 20 capsule, Rfl: 0 .  cetirizine (ZYRTEC) 10 MG tablet, Take 1 tablet (10 mg total) by mouth daily., Disp: 30 tablet, Rfl: 0 .  fluticasone (FLONASE) 50 MCG/ACT nasal spray, Place 2 sprays into both nostrils daily., Disp: 16 mL, Rfl: 0 .  glipiZIDE (GLUCOTROL) 5 MG tablet, Take by mouth daily before breakfast., Disp: , Rfl:  .  labetalol (NORMODYNE) 200 MG tablet, Take 200 mg by mouth 2 (two) times daily., Disp: , Rfl:  .  losartan (COZAAR) 100 MG tablet, Take 100 mg by mouth daily., Disp: , Rfl:  .  rosuvastatin (CRESTOR) 40 MG tablet, Take 40 mg by mouth daily., Disp: , Rfl:  .  spironolactone-hydrochlorothiazide (ALDACTAZIDE) 50-50 MG tablet, Take 1 tablet by mouth daily., Disp: , Rfl:   Objective: Patient sounds  well.  They are in no apparent distress.  Breathing is non labored.  Mood and behavior are normal.  Laboratory Data:  Recent Results (from the past 2160 hour(s))  SARS CORONAVIRUS 2 (TAT 6-24 HRS) Nasopharyngeal Nasopharyngeal Swab     Status: Abnormal   Collection Time: 01/01/21 10:55 AM   Specimen: Nasopharyngeal Swab  Result Value Ref Range   SARS Coronavirus 2 POSITIVE (A) NEGATIVE    Comment: (NOTE) SARS-CoV-2 target nucleic acids are DETECTED.  The SARS-CoV-2 RNA is generally detectable in upper and lower respiratory specimens during the acute phase of infection. Positive results are indicative of the presence of SARS-CoV-2 RNA. Clinical correlation with patient history and other diagnostic information is  necessary to determine patient infection status. Positive results do not rule out bacterial infection or co-infection with other viruses.  The expected result is Negative.  Fact Sheet for Patients: HairSlick.no  Fact Sheet for Healthcare Providers: quierodirigir.com  This test is not yet approved or cleared by the [place] FDA and  has been authorized for detection and/or diagnosis of SARS-CoV-2 by FDA under an Emergency Use Authorization (EUA). This EUA will remain  in effect (meaning this test can be used) for the duration of the COVID-19 declaration under Section 564(b)(1) of the Act, 21 U. S.C. section 360bbb-3(b)(1), unless the authorization is terminated or revoked sooner.   Performed at [facility], [address] [place], [place] [address]      Assessment: 80 y.o. female with mild/moderate COVID 19 viral infection diagnosed on 12/31/20  at high risk for progression to severe COVID 19.  Plan:  This patient is a 80 y.o. female that meets the following criteria for Emergency Use Authorization of: Molnupiravir  1. Age >18 yr 2. SARS-COV-2 positive test 3. Symptom onset < 5  days 4. Mild-to-moderate COVID disease with high risk for severe progression to hospitalization or death   I have spoken and communicated the following to the patient or parent/caregiver regarding: 1. Molnupiravir is an unapproved drug that is authorized for use under an TEFL teacher.  2. There are no adequate, approved, available products for the treatment of COVID-19 in adults who have mild-to-moderate COVID-19 and are at high risk for progressing to severe COVID-19, including hospitalization or death. 3. Other therapeutics are currently authorized. For additional information on all products authorized for treatment or prevention of COVID-19, please see https://www.graham-miller.com/.  4. There are benefits and risks of taking this treatment as outlined in the "Fact Sheet for Patients and Caregivers."  5. "Fact Sheet for Patients and Caregivers" was reviewed with patient. A hard copy will be provided to patient from pharmacy prior to the patient receiving treatment. 6. Patients should continue to self-isolate and use infection control measures (e.g., wear mask, isolate, social distance, avoid sharing personal items, clean and disinfect "high touch" surfaces, and frequent handwashing) according to CDC guidelines.  7. The patient or parent/caregiver has the option to accept or refuse treatment. 8. [employer] has established a pregnancy surveillance program. 9. Females of childbearing potential should use a reliable method of contraception correctly and consistently, as applicable, for the duration of treatment and for 4 days after the last dose of Molnupiravir. 10. Males of reproductive potential who are sexually active with females of childbearing potential should use a reliable method of contraception correctly and consistently during treatment and for at least 3 months after the last  dose. 11. Pregnancy status and risk was assessed. Patient verbalized understanding of precautions.   After reviewing above information with the patient, the patient agrees to receive molnupiravir.  Follow up instructions:    . Take prescription BID x 5 days as directed . Reach out to pharmacist for counseling on medication if desired . For concerns regarding further COVID symptoms please follow up with your PCP or urgent care . For urgent or life-threatening issues, seek care at your local emergency department  The patient was provided an opportunity to ask questions, and all were answered. The patient agreed with the plan and demonstrated an understanding of the instructions.   [facility]-  The patient was advised to call their PCP or seek an in-person evaluation if the symptoms worsen or if the condition fails to improve as anticipated.   I provided 15 minutes of non face-to-face telephone visit time during this encounter, and > 50% was spent counseling as documented under my assessment & plan.  Mauro Kaufmann, NP 01/03/2021 /12:21 PM

## 2021-06-18 ENCOUNTER — Ambulatory Visit (HOSPITAL_COMMUNITY)
Admission: EM | Admit: 2021-06-18 | Discharge: 2021-06-18 | Disposition: A | Discharge status: Home / Self Care | Payer: Medicare Other | Attending: Emergency Medicine | Admitting: Emergency Medicine

## 2021-06-18 ENCOUNTER — Encounter (HOSPITAL_COMMUNITY): Payer: Self-pay

## 2021-06-18 ENCOUNTER — Other Ambulatory Visit: Payer: Self-pay

## 2021-06-18 DIAGNOSIS — Z20822 Contact with and (suspected) exposure to covid-19: Secondary | ICD-10-CM | POA: Insufficient documentation

## 2021-06-18 DIAGNOSIS — J069 Acute upper respiratory infection, unspecified: Secondary | ICD-10-CM | POA: Insufficient documentation

## 2021-06-18 LAB — SARS CORONAVIRUS 2 (TAT 6-24 HRS): SARS Coronavirus 2: NEGATIVE

## 2021-06-18 MED ORDER — BENZONATATE 200 MG PO CAPS: 200.0000 mg | ORAL_CAPSULE | Freq: Three times a day (TID) | ORAL | 0 refills | Status: DC | PRN

## 2021-06-18 MED ORDER — FLUTICASONE PROPIONATE 50 MCG/ACT NA SUSP: 2.0000 | Freq: Every day | NASAL | 0 refills | Status: DC

## 2021-06-18 NOTE — Discharge Instructions (Signed)
COVID test will be back in about 24 hours.  We will contact you and prescribe Molnupiravir if it is positive.  In the meantime, saline nasal irrigation with a Lloyd Huger Med rinse and distilled water as often as you want, continue Mucinex, may add 400 mg of ibuprofen to the Tylenol 3-4 times a day.  Flonase also help with the nasal congestion, Tessalon for the cough.

## 2021-06-18 NOTE — ED Triage Notes (Signed)
Pt is c/o of sore throat, cough, and nasal congestion for the past two days; pt denies body aches and fever; pt stated that she went to vote on Tuesday and the next day she woke up with symptoms

## 2021-06-18 NOTE — ED Provider Notes (Signed)
8HPI  SUBJECTIVE:  Donna Torres is a 80 y.o. female who presents with 2 days of a dry cough, laryngitis, mild headache, nasal congestion.  No fevers, body aches, rhinorrhea, sore throat, postnasal drip, loss of sense of smell or taste, wheezing, shortness of breath, nausea, vomiting, diarrhea, abdominal pain.  No known RSV, COVID, flu exposure.  She got the second dose of the COVID-vaccine and the flu vaccine.  She had similar symptoms with COVID last year in October 2021.  States that she is unable to sleep at night secondary to the cough.  She has tried Mucinex and Tylenol without improvement in her symptoms.  Aggravating factors.  She has a past medical history of hypertension, hypercholesterolemia, diabetes, COVID and 2021.  RKY:[facility], Janifer Adie, PA-C   Past Medical History:  Diagnosis Date   Diabetes mellitus without complication (HCC)    High cholesterol    Hypertension     History reviewed. No pertinent surgical history.  Family History  Problem Relation Age of Onset   Cancer Mother    Prostate cancer Father     Social History   Tobacco Use   Smoking status: Never   Smokeless tobacco: Never  Vaping Use   Vaping Use: Never used  Substance Use Topics   Alcohol use: Never   Drug use: Never    No current facility-administered medications for this encounter.  Current Outpatient Medications:    benzonatate (TESSALON) 200 MG capsule, Take 1 capsule (200 mg total) by mouth 3 (three) times daily as needed for cough., Disp: 30 capsule, Rfl: 0   fluticasone (FLONASE) 50 MCG/ACT nasal spray, Place 2 sprays into both nostrils daily., Disp: 16 g, Rfl: 0   glipiZIDE (GLUCOTROL) 5 MG tablet, Take by mouth daily before breakfast., Disp: , Rfl:    labetalol (NORMODYNE) 200 MG tablet, Take 200 mg by mouth 2 (two) times daily., Disp: , Rfl:    losartan (COZAAR) 100 MG tablet, Take 100 mg by mouth daily., Disp: , Rfl:    rosuvastatin (CRESTOR) 40 MG tablet, Take 40 mg by mouth daily., Disp:  , Rfl:    spironolactone-hydrochlorothiazide (ALDACTAZIDE) 50-50 MG tablet, Take 1 tablet by mouth daily., Disp: , Rfl:   No Known Allergies   ROS  As noted in HPI.   Physical Exam  BP (!) 155/73 (BP Location: Left Arm)   Pulse 65   Temp 97.9 F (36.6 C) (Oral)   Resp 18   SpO2 97%   Constitutional: Well developed, well nourished, no acute distress Eyes:  EOMI, conjunctiva normal bilaterally HENT: Normocephalic, atraumatic,mucus membranes moist.  Positive nasal congestion.  Normal turbinates.  No maxillary, frontal sinus tenderness.  No postnasal drip Neck: No cervical adenopathy Respiratory: Normal inspiratory effort, lungs clear bilaterally Cardiovascular: Normal rate, regular rhythm, no murmurs rubs or gallop  GI: nondistended skin: No rash, skin intact Musculoskeletal: no deformities Neurologic: Alert & oriented x 3, no focal neuro deficits Psychiatric: Speech and behavior appropriate   ED Course   Medications - No data to display  Orders Placed This Encounter  Procedures   SARS CORONAVIRUS 2 (TAT 6-24 HRS) Nasopharyngeal Nasopharyngeal Swab    Standing Status:   Standing    Number of Occurrences:   1    Results for orders placed or performed during the hospital encounter of 06/18/21 (from the past 24 hour(s))  SARS CORONAVIRUS 2 (TAT 6-24 HRS) Nasopharyngeal Nasopharyngeal Swab     Status: None   Collection Time: 06/18/21 11:18 AM  Specimen: Nasopharyngeal Swab  Result Value Ref Range   SARS Coronavirus 2 NEGATIVE NEGATIVE   No results found.  ED Clinical Impression  1. Upper respiratory tract infection, unspecified type   2. Encounter for laboratory testing for COVID-19 virus      ED Assessment/Plan  Patient with a URI.  Testing for COVID.  She will be a candidate for Molnupiravir if COVID is positive.  In the meantime, saline nasal irrigation, continue Mucinex, may add 400 mg of ibuprofen with the Tylenol 3-4 times a day.  Flonase, Tessalon.   Follow-up with PMD as needed.  COVID-negative.  Plan as above.  Discussed labs, imaging, MDM, treatment plan, and plan for follow-up with patient. . patient agrees with plan.   Meds ordered this encounter  Medications   benzonatate (TESSALON) 200 MG capsule    Sig: Take 1 capsule (200 mg total) by mouth 3 (three) times daily as needed for cough.    Dispense:  30 capsule    Refill:  0   fluticasone (FLONASE) 50 MCG/ACT nasal spray    Sig: Place 2 sprays into both nostrils daily.    Dispense:  16 g    Refill:  0      *This clinic note was created using Scientist, clinical (histocompatibility and immunogenetics). Therefore, there may be occasional mistakes despite careful proofreading.  ?    Domenick Gong, MD 06/19/21 [phone]

## 2021-07-21 IMAGING — DX DG CHEST 2V
2 series · 2 of 2 positions shown · non-contrast
Comparison: 11/07/2018

CLINICAL DATA: Cough, fever, and headache for 3 days

EXAM:
CHEST - 2 VIEW

[chest ap]
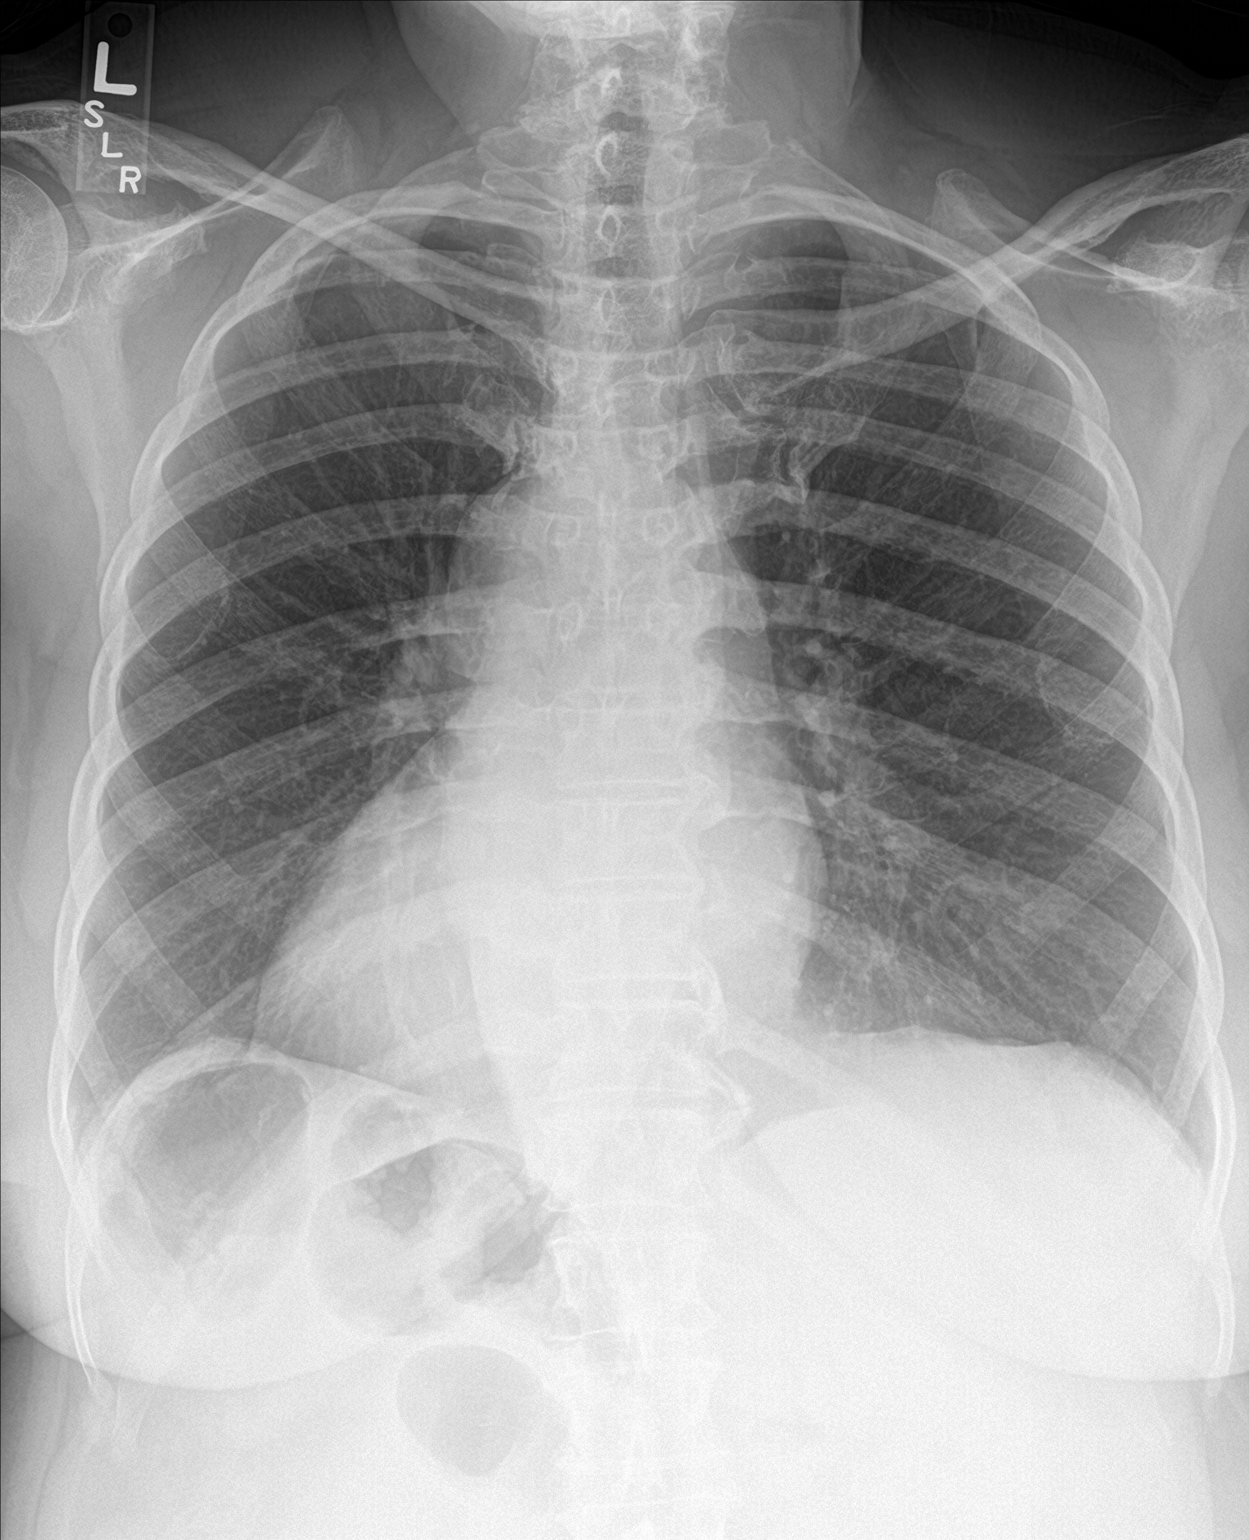

[chest lat]
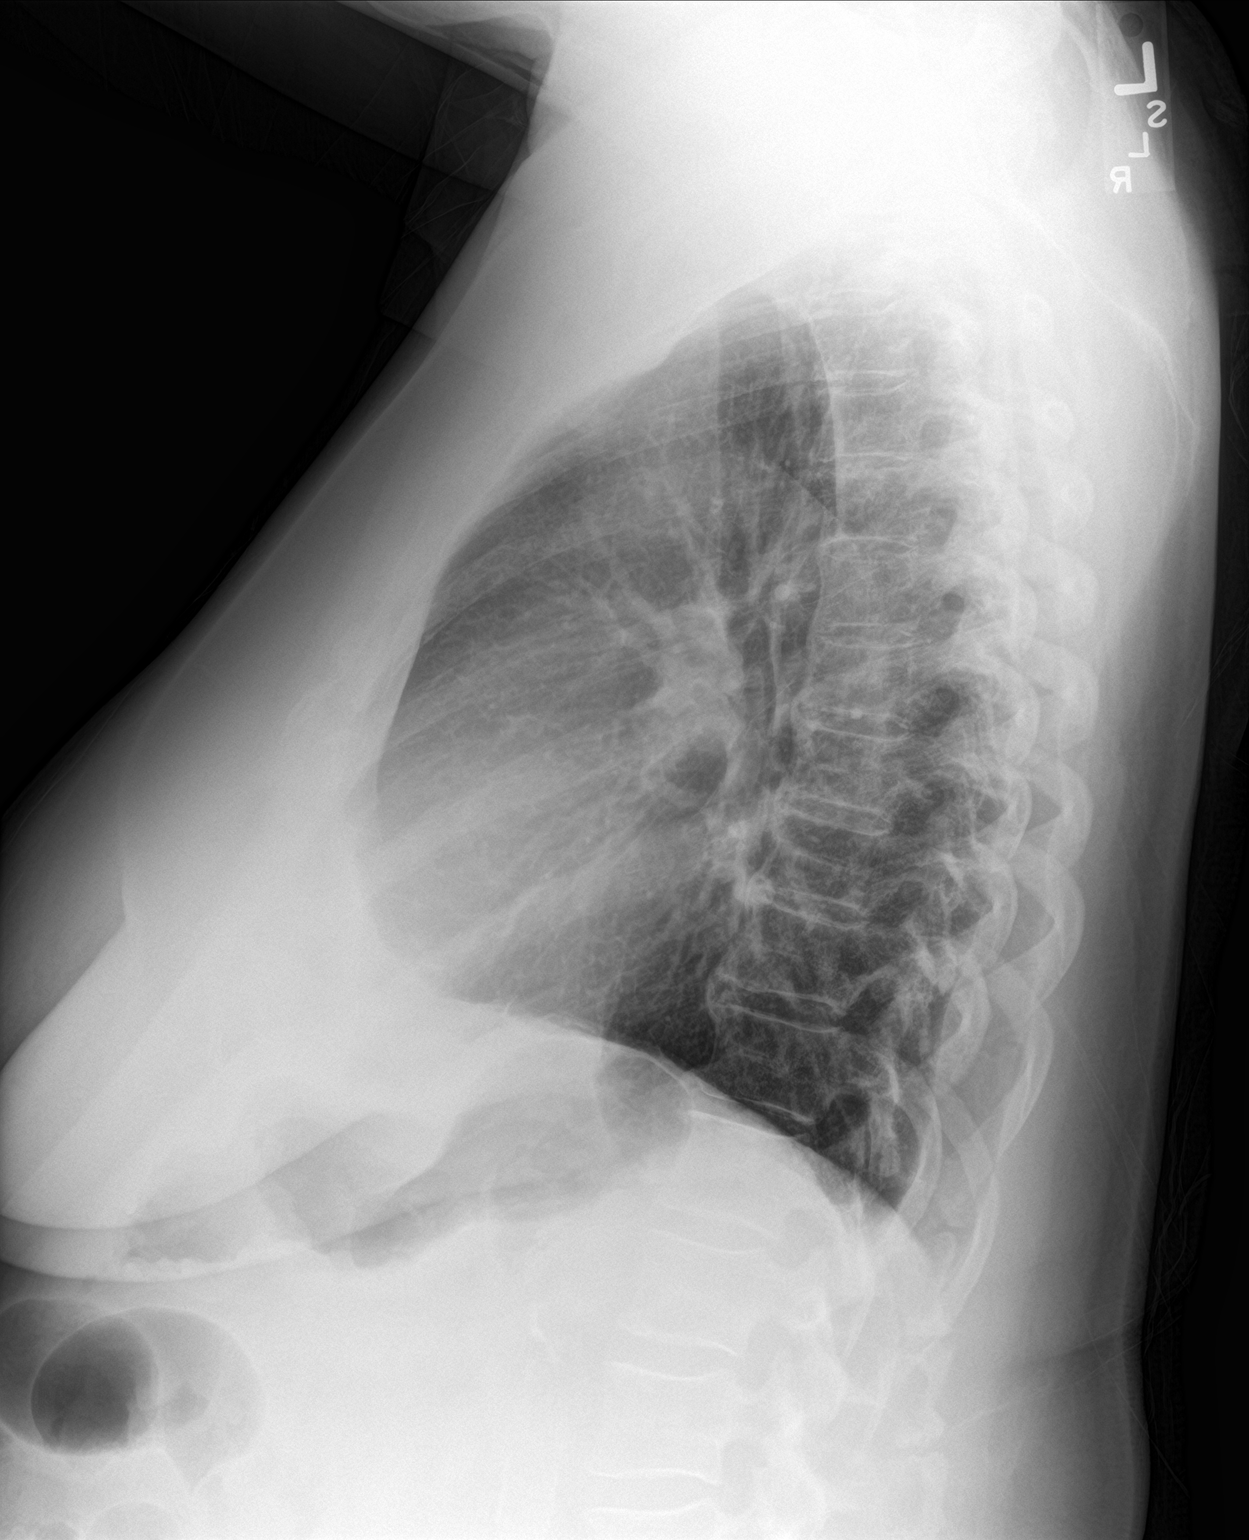

[2 of 2 positions shown; findings below may reference images not displayed]

FINDINGS: Normal heart size, mediastinal contours, and pulmonary vascularity.

Lungs clear.

No pleural effusion or pneumothorax.

Bones unremarkable.
IMPRESSION: Normal exam.

## 2022-10-02 ENCOUNTER — Ambulatory Visit (HOSPITAL_COMMUNITY)
Admission: EM | Admit: 2022-10-02 | Discharge: 2022-10-02 | Disposition: A | Discharge status: Home / Self Care | Payer: Medicare Other | Attending: Emergency Medicine | Admitting: Emergency Medicine

## 2022-10-02 ENCOUNTER — Encounter (HOSPITAL_COMMUNITY): Payer: Self-pay

## 2022-10-02 ENCOUNTER — Ambulatory Visit (INDEPENDENT_AMBULATORY_CARE_PROVIDER_SITE_OTHER): Payer: Medicare Other

## 2022-10-02 DIAGNOSIS — Z20822 Contact with and (suspected) exposure to covid-19: Secondary | ICD-10-CM | POA: Diagnosis present

## 2022-10-02 DIAGNOSIS — J069 Acute upper respiratory infection, unspecified: Secondary | ICD-10-CM | POA: Diagnosis present

## 2022-10-02 DIAGNOSIS — R059 Cough, unspecified: Secondary | ICD-10-CM | POA: Diagnosis not present

## 2022-10-02 MED ORDER — IPRATROPIUM BROMIDE 0.06 % NA SOLN: 2.0000 | Freq: Four times a day (QID) | NASAL | 0 refills | Status: AC

## 2022-10-02 MED ORDER — BENZONATATE 200 MG PO CAPS: 200.0000 mg | ORAL_CAPSULE | Freq: Three times a day (TID) | ORAL | 0 refills | Status: DC | PRN

## 2022-10-02 NOTE — Discharge Instructions (Signed)
We will contact you if your COVID comes back positive and we will prescribe renally dosed Paxlovid.  Your x-ray was negative for pneumonia.  In the meantime, you can take at 1000 mg of Tylenol for 3 times a day as needed for pain, start saline nasal irrigation with a Milta Deiters Med rinse and distilled water as often as you want, follow the directions on the box.  Atrovent nasal spray for the nasal congestion, postnasal drip, continue Mucinex, Tessalon for the cough.

## 2022-10-02 NOTE — ED Provider Notes (Signed)
HPI  SUBJECTIVE:  Donna Torres is a 82 y.o. female who presents with 4 days of a dry, deep cough, nasal congestion, clear rhinorrhea, sinus pain and pressure, postnasal drip, dyspnea with heavy exertion.  She reports diffuse chest pain, worse in the substernal region present only with coughing.  She was unable to sleep last night secondary to the postnasal drip.  She reports body aches.  No fevers above 100.4, wheezing, shortness of breath, facial swelling, upper dental pain, nausea, vomiting, diarrhea, abdominal pain.  No known COVID, flu exposure.  She got 2 doses of the COVID-vaccine and got this years flu vaccine.  She has tried Tylenol and Mucinex without improvement in her symptoms.  Last dose of Tylenol was within 6 hours of evaluation.  No aggravating factors.  No antibiotics in the past 3 months.  She has a past medical history of diabetes, hypertension, hypercholesterolemia.  Denies history of chronic kidney disease.  PCP: [facility]    Past Medical History:  Diagnosis Date   Diabetes mellitus without complication ([place])    High cholesterol    Hypertension     History reviewed. No pertinent surgical history.  Family History  Problem Relation Age of Onset   Cancer Mother    Prostate cancer Father     Social History   Tobacco Use   Smoking status: Never   Smokeless tobacco: Never  Vaping Use   Vaping Use: Never used  Substance Use Topics   Alcohol use: Never   Drug use: Never    No current facility-administered medications for this encounter.  Current Outpatient Medications:    benzonatate (TESSALON) 200 MG capsule, Take 1 capsule (200 mg total) by mouth 3 (three) times daily as needed for cough., Disp: 30 capsule, Rfl: 0   glipiZIDE (GLUCOTROL) 5 MG tablet, Take by mouth daily before breakfast., Disp: , Rfl:    ipratropium (ATROVENT) 0.06 % nasal spray, Place 2 sprays into both nostrils 4 (four) times daily., Disp: 15 mL, Rfl: 0   labetalol (NORMODYNE) 200 MG  tablet, Take 200 mg by mouth 2 (two) times daily., Disp: , Rfl:    losartan (COZAAR) 100 MG tablet, Take 100 mg by mouth daily., Disp: , Rfl:    rosuvastatin (CRESTOR) 40 MG tablet, Take 40 mg by mouth daily., Disp: , Rfl:    spironolactone-hydrochlorothiazide (ALDACTAZIDE) 50-50 MG tablet, Take 1 tablet by mouth daily., Disp: , Rfl:   No Known Allergies   ROS  As noted in HPI.   Physical Exam  BP (!) 153/73 (BP Location: Right Arm)   Pulse 77   Temp 99.5 F (37.5 C) (Oral)   Resp 12   SpO2 97%   Constitutional: Well developed, well nourished, no acute distress.  Occasionally coughing. Eyes:  EOMI, conjunctiva normal bilaterally HENT: Normocephalic, atraumatic,mucus membranes moist.  Clear nasal congestion.  Erythematous, swollen turbinates.  No maxillary, frontal sinus tenderness.  Tonsils surgically absent.  Uvula midline.  No postnasal drip. Neck: No cervical lymphadenopathy. Respiratory: Normal inspiratory effort, good air movement, lungs clear bilaterally.,  No anterior, lateral chest wall tenderness Cardiovascular: Normal rate, regular rhythm, no murmurs rubs or gallops GI: nondistended skin: No rash, skin intact Musculoskeletal: no deformities Neurologic: Alert & oriented x 3, no focal neuro deficits Psychiatric: Speech and behavior appropriate   ED Course   Medications - No data to display  Orders Placed This Encounter  Procedures   SARS CORONAVIRUS 2 (TAT 6-24 HRS) Anterior Nasal Swab    Standing Status:  Standing    Number of Occurrences:   1   DG Chest 2 View    Standing Status:   Standing    Number of Occurrences:   1    Order Specific Question:   Reason for Exam (SYMPTOM  OR DIAGNOSIS REQUIRED)    Answer:   Cough, rule out pneumonia    No results found for this or any previous visit (from the past 24 hour(s)). DG Chest 2 View  Result Date: 10/02/2022 CLINICAL DATA:  Cough.  Evaluate for pneumonia. EXAM: CHEST - 2 VIEW COMPARISON:  Chest radiographs  01/01/2021 FINDINGS: Cardiac silhouette and mediastinal contours are within normal limits. Mild calcification within the aortic arch. The lungs are clear. No pleural effusion or pneumothorax. Mild multilevel degenerative disc changes of the thoracic spine. IMPRESSION: No active cardiopulmonary disease. Electronically Signed   By: Yvonne Kendall M.D.   On: 10/02/2022 11:36    ED Clinical Impression  1. Viral URI with cough   2. Encounter for laboratory testing for COVID-19 virus      ED Assessment/Plan     Outside labs reviewed. GFR from outside labs done in 2/24 35   Checking chest x-ray due to age.  Checking COVID as will prescribe renally dosed Paxlovid if it is positive due to multiple comorbidities.  Deferring influenza testing as she is out of treatment window.  Reviewed imaging independently.  No pneumonia.  See radiology report for full details.  Chest x-ray negative for pneumonia.  In the meantime, we will treat as an upper respiratory infection/viral sinusitis with saline nasal irrigation, Tylenol, at Atrovent nasal spray, Tessalon.  Follow-up with PCP as needed.  ER return precautions given.  Discussed imaging, MDM, treatment plan, and plan for follow-up with patient. Discussed sn/sx that should prompt return to the ED. patient agrees with plan.   Meds ordered this encounter  Medications   ipratropium (ATROVENT) 0.06 % nasal spray    Sig: Place 2 sprays into both nostrils 4 (four) times daily.    Dispense:  15 mL    Refill:  0   benzonatate (TESSALON) 200 MG capsule    Sig: Take 1 capsule (200 mg total) by mouth 3 (three) times daily as needed for cough.    Dispense:  30 capsule    Refill:  0      *This clinic note was created using Lobbyist. Therefore, there may be occasional mistakes despite careful proofreading.  ?    Melynda Ripple, MD 10/02/22 1159

## 2022-10-02 NOTE — ED Triage Notes (Signed)
Pt is here for a cough , runny nose , nasal congestion , neck pain , body aches and chills, temp taken at home was 99.4., low energy, eating less x 3days

## 2022-10-03 ENCOUNTER — Telehealth (HOSPITAL_COMMUNITY): Payer: Self-pay | Admitting: Emergency Medicine

## 2022-10-03 LAB — SARS CORONAVIRUS 2 (TAT 6-24 HRS): SARS Coronavirus 2: POSITIVE — AB

## 2022-10-03 MED ORDER — NIRMATRELVIR/RITONAVIR (PAXLOVID) TABLET (RENAL DOSING): 2.0000 | ORAL_TABLET | Freq: Two times a day (BID) | ORAL | 0 refills | Status: AC

## 2023-01-09 ENCOUNTER — Ambulatory Visit (HOSPITAL_COMMUNITY)
Admission: EM | Admit: 2023-01-09 | Discharge: 2023-01-09 | Disposition: A | Discharge status: Home / Self Care | Payer: Medicare Other | Attending: Internal Medicine | Admitting: Internal Medicine

## 2023-01-09 ENCOUNTER — Encounter (HOSPITAL_COMMUNITY): Payer: Self-pay | Admitting: Emergency Medicine

## 2023-01-09 DIAGNOSIS — J309 Allergic rhinitis, unspecified: Secondary | ICD-10-CM

## 2023-01-09 MED ORDER — PROMETHAZINE-DM 6.25-15 MG/5ML PO SYRP: 5.0000 mL | ORAL_SOLUTION | Freq: Four times a day (QID) | ORAL | 0 refills | Status: DC | PRN

## 2023-01-09 MED ORDER — SALINE SPRAY 0.65 % NA SOLN: 1.0000 | NASAL | 0 refills | Status: AC | PRN

## 2023-01-09 NOTE — ED Provider Notes (Signed)
[facility]    CSN: [id number] Arrival date & time: 01/09/23  8657      History   Chief Complaint Chief Complaint  Patient presents with   Cough    HPI Donna Torres is a 82 y.o. female comes to urgent care with 2-day history of nonproductive cough, nasal congestion and frontal headaches.  Symptoms started insidiously and has been persistent.  She denies any postnasal drainage.  No history of allergies.  No fever or chills.  No nausea or vomiting.  No sick contacts.  Patient has been using Flonase, Mucinex and Tessalon Perles for cough with no significant improvement.  Patient has nasal congestion but no nasal discharge.  No shortness of breath or wheezing.  No cough.  No history of seasonal allergies.  HPI  Past Medical History:  Diagnosis Date   Diabetes mellitus without complication (HCC)    High cholesterol    Hypertension     There are no problems to display for this patient.   History reviewed. No pertinent surgical history.  OB History   No obstetric history on file.      Home Medications    Prior to Admission medications   Medication Sig Start Date End Date Taking? Authorizing Provider  promethazine-dextromethorphan (PROMETHAZINE-DM) 6.25-15 MG/5ML syrup Take 5 mLs by mouth 4 (four) times daily as needed for cough. 01/09/23  Yes Daimion Adamcik, Britta Mccreedy, MD  sodium chloride (OCEAN) 0.65 % SOLN nasal spray Place 1 spray into both nostrils as needed for congestion. 01/09/23  Yes Sarissa Dern, Britta Mccreedy, MD  benzonatate (TESSALON) 200 MG capsule Take 1 capsule (200 mg total) by mouth 3 (three) times daily as needed for cough. 10/02/22   Domenick Gong, MD  glipiZIDE (GLUCOTROL) 5 MG tablet Take by mouth daily before breakfast.    [provider]  ipratropium (ATROVENT) 0.06 % nasal spray Place 2 sprays into both nostrils 4 (four) times daily. 10/02/22   Domenick Gong, MD  labetalol (NORMODYNE) 200 MG tablet Take 200 mg by mouth 2 (two) times daily.     [provider]  losartan (COZAAR) 100 MG tablet Take 100 mg by mouth daily.    [provider]  rosuvastatin (CRESTOR) 40 MG tablet Take 40 mg by mouth daily.    [provider]  spironolactone-hydrochlorothiazide (ALDACTAZIDE) 50-50 MG tablet Take 1 tablet by mouth daily.    [provider]    Family History Family History  Problem Relation Age of Onset   Cancer Mother    Prostate cancer Father     Social History Social History   Tobacco Use   Smoking status: Never   Smokeless tobacco: Never  Vaping Use   Vaping Use: Never used  Substance Use Topics   Alcohol use: Never   Drug use: Never     Allergies   Patient has no known allergies.   Review of Systems Review of Systems As per HPI  Physical Exam Triage Vital Signs ED Triage Vitals  Enc Vitals Group     BP 01/09/23 1106 (!) 160/75     Pulse Rate 01/09/23 1106 80     Resp 01/09/23 1106 20     Temp 01/09/23 1106 98.3 F (36.8 C)     Temp Source 01/09/23 1106 Oral     SpO2 01/09/23 1106 98 %     Weight --      Height --      Head Circumference --      Peak Flow --  Pain Score 01/09/23 1104 0     Pain Loc --      Pain Edu? --      Excl. in GC? --    No data found.  Updated Vital Signs BP (!) 160/75 (BP Location: Right Arm)   Pulse 80   Temp 98.3 F (36.8 C) (Oral)   Resp 20   SpO2 98%   Visual Acuity Right Eye Distance:   Left Eye Distance:   Bilateral Distance:    Right Eye Near:   Left Eye Near:    Bilateral Near:     Physical Exam Vitals and nursing note reviewed.  Constitutional:      Appearance: Normal appearance.  HENT:     Right Ear: Tympanic membrane normal.     Left Ear: Tympanic membrane normal.     Nose: Congestion present.  Cardiovascular:     Rate and Rhythm: Normal rate and regular rhythm.     Pulses: Normal pulses.     Heart sounds: Normal heart sounds.  Pulmonary:     Effort: Pulmonary effort is normal.     Breath sounds:  Normal breath sounds.  Abdominal:     General: Bowel sounds are normal.     Palpations: Abdomen is soft.  Neurological:     Mental Status: She is alert.      UC Treatments / Results  Labs (all labs ordered are listed, but only abnormal results are displayed) Labs Reviewed - No data to display  EKG   Radiology No results found.  Procedures Procedures (including critical care time)  Medications Ordered in UC Medications - No data to display  Initial Impression / Assessment and Plan / UC Course  I have reviewed the triage vital signs and the nursing notes.  Pertinent labs & imaging results that were available during my care of the patient were reviewed by me and considered in my medical decision making (see chart for details).     1.  Sinusitis likely seasonal allergy related: Saline nasal spray Patient is encouraged to use a humidifier Continue Flonase and Mucinex use No indication for viral testing Return precautions given. Final Clinical Impressions(s) / UC Diagnoses   Final diagnoses:  Allergic sinusitis     Discharge Instructions      Please use salt water nasal spray to help with nasal congestion and sinus pressure Continue using Flonase and Mucinex You may use Tylenol as needed for pain and/or fever You do not need any testing for viruses given the duration of your symptoms Please return to urgent care if symptoms worsens.   ED Prescriptions     Medication Sig Dispense Auth. Provider   sodium chloride (OCEAN) 0.65 % SOLN nasal spray Place 1 spray into both nostrils as needed for congestion. 104 mL Merrilee Jansky, MD   promethazine-dextromethorphan (PROMETHAZINE-DM) 6.25-15 MG/5ML syrup Take 5 mLs by mouth 4 (four) times daily as needed for cough. 118 mL Lonzell Dorris, Britta Mccreedy, MD      PDMP not reviewed this encounter.   Merrilee Jansky, MD 01/09/23 2004

## 2023-01-09 NOTE — Discharge Instructions (Signed)
Please use salt water nasal spray to help with nasal congestion and sinus pressure Continue using Flonase and Mucinex You may use Tylenol as needed for pain and/or fever You do not need any testing for viruses given the duration of your symptoms Please return to urgent care if symptoms worsens.

## 2023-01-09 NOTE — ED Triage Notes (Signed)
Pt taking Tylenol and Mucinex as well as Flonase and Tessalon for cough and congestion since last Friday. Last night had nose bleed.

## 2023-12-27 ENCOUNTER — Ambulatory Visit (HOSPITAL_COMMUNITY)
Admission: EM | Admit: 2023-12-27 | Discharge: 2023-12-27 | Disposition: A | Discharge status: Home / Self Care | Attending: Family Medicine | Admitting: Family Medicine

## 2023-12-27 ENCOUNTER — Ambulatory Visit (INDEPENDENT_AMBULATORY_CARE_PROVIDER_SITE_OTHER)

## 2023-12-27 ENCOUNTER — Other Ambulatory Visit: Payer: Self-pay

## 2023-12-27 ENCOUNTER — Encounter (HOSPITAL_COMMUNITY): Payer: Self-pay | Admitting: *Deleted

## 2023-12-27 DIAGNOSIS — M25561 Pain in right knee: Secondary | ICD-10-CM | POA: Diagnosis not present

## 2023-12-27 DIAGNOSIS — M25511 Pain in right shoulder: Secondary | ICD-10-CM | POA: Diagnosis not present

## 2023-12-27 MED ORDER — TRAMADOL HCL 50 MG PO TABS: 50.0000 mg | ORAL_TABLET | Freq: Four times a day (QID) | ORAL | 0 refills | Status: AC | PRN

## 2023-12-27 NOTE — ED Triage Notes (Signed)
 PT DOB and Full Name confirmed

## 2023-12-27 NOTE — Discharge Instructions (Addendum)
 By my review there are no broken bones on your x-rays.  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  The radiologist will also read your x-ray, and if their interpretation differs significantly from mine, and the management of your condition would change, we will call you.  Take tramadol 50 mg-- 1 tablet every 6 hours as needed for pain.  This medication can make you sleepy or dizzy  Follow-up with your primary care

## 2023-12-27 NOTE — ED Provider Notes (Signed)
 [facility]    CSN: [id number] Arrival date & time: 12/27/23  1501      History   Chief Complaint Chief Complaint  Patient presents with   Shoulder Pain   Knee Pain    HPI Donna Torres is a 83 y.o. female.    Shoulder Pain Knee Pain Here for right shoulder and right knee pain.  The symptoms began 2 days ago.  No trauma or fall.  No fever or rash.  She has never had pains in these areas before. The right shoulder hurts when she lets her arm hanging down or when she abducts the right arm.  Her right knee is hurting but is not swelling.  Past medical history significant for diabetes which show fair control at 7.8 with her last A1c about a week ago.  Her last EGFR was 38.  NKDA   Past Medical History:  Diagnosis Date   Diabetes mellitus without complication (HCC)    High cholesterol    Hypertension     There are no active problems to display for this patient.   History reviewed. No pertinent surgical history.  OB History   No obstetric history on file.      Home Medications    Prior to Admission medications   Medication Sig Start Date End Date Taking? Authorizing Provider  glipiZIDE (GLUCOTROL) 5 MG tablet Take by mouth daily before breakfast. Changed to Glipizide 10mg  24 hr tablet   Yes [provider]  labetalol (NORMODYNE) 200 MG tablet Take 200 mg by mouth 2 (two) times daily.   Yes [provider]  losartan (COZAAR) 100 MG tablet Take 100 mg by mouth daily.   Yes [provider]  rosuvastatin (CRESTOR) 40 MG tablet Take 40 mg by mouth daily.   Yes [provider]  spironolactone-hydrochlorothiazide (ALDACTAZIDE) 50-50 MG tablet Take 1 tablet by mouth daily.   Yes [provider]  traMADol (ULTRAM) 50 MG tablet Take 1 tablet (50 mg total) by mouth every 6 (six) hours as needed (pain). 12/27/23  Yes Lindbergh Winkles K, MD  ipratropium (ATROVENT ) 0.06 % nasal spray Place 2 sprays into both nostrils 4 (four)  times daily. 10/02/22   Ethlyn Herd, MD  sodium chloride (OCEAN) 0.65 % SOLN nasal spray Place 1 spray into both nostrils as needed for congestion. 01/09/23   Lamptey, Donley Furth, MD    Family History Family History  Problem Relation Age of Onset   Cancer Mother    Prostate cancer Father     Social History Social History   Tobacco Use   Smoking status: Never   Smokeless tobacco: Never  Vaping Use   Vaping status: Never Used  Substance Use Topics   Alcohol use: Never   Drug use: Never     Allergies   Patient has no known allergies.   Review of Systems Review of Systems   Physical Exam Triage Vital Signs ED Triage Vitals [12/27/23 1551]  Encounter Vitals Group     BP (!) 149/76     Systolic BP Percentile      Diastolic BP Percentile      Pulse Rate 71     Resp 20     Temp 98 F (36.7 C)     Temp src      SpO2 97 %     Weight      Height      Head Circumference      Peak Flow  Pain Score      Pain Loc      Pain Education      Exclude from Growth Chart    No data found.  Updated Vital Signs BP (!) 149/76   Pulse 71   Temp 98 F (36.7 C)   Resp 20   SpO2 97%   Visual Acuity Right Eye Distance:   Left Eye Distance:   Bilateral Distance:    Right Eye Near:   Left Eye Near:    Bilateral Near:     Physical Exam Vitals reviewed.  Constitutional:      General: She is not in acute distress.    Appearance: She is not ill-appearing, toxic-appearing or diaphoretic.  HENT:     Mouth/Throat:     Mouth: Mucous membranes are moist.  Eyes:     Extraocular Movements: Extraocular movements intact.     Pupils: Pupils are equal, round, and reactive to light.  Cardiovascular:     Pulses: Normal pulses.  Musculoskeletal:     Cervical back: Neck supple.     Comments: There is no tenderness of the right knee or shoulder.  Range of motion causes pain on abduction at the shoulder.  No effusion of the knee.    Lymphadenopathy:     Cervical: No  cervical adenopathy.  Skin:    Coloration: Skin is not jaundiced or pale.  Neurological:     General: No focal deficit present.     Mental Status: She is alert and oriented to person, place, and time.  Psychiatric:        Behavior: Behavior normal.      UC Treatments / Results  Labs (all labs ordered are listed, but only abnormal results are displayed) Labs Reviewed - No data to display  EKG   Radiology DG Shoulder Right Result Date: 12/27/2023 CLINICAL DATA:  Right shoulder pain EXAM: RIGHT SHOULDER - 2+ VIEW COMPARISON:  None Available. FINDINGS: There is no evidence of fracture or dislocation. There is no evidence of arthropathy or other focal bone abnormality. Soft tissues are unremarkable. Osteoarthrosis acromioclavicular joint. IMPRESSION: Negative. Electronically Signed   By: Fredrich Jefferson M.D.   On: 12/27/2023 16:59    Procedures Procedures (including critical care time)  Medications Ordered in UC Medications - No data to display  Initial Impression / Assessment and Plan / UC Course  I have reviewed the triage vital signs and the nursing notes.  Pertinent labs & imaging results that were available during my care of the patient were reviewed by me and considered in my medical decision making (see chart for details).     There are degenerative changes on her x-rays of her shoulder and knee by my review.  She is advised of radiology overread.  Small quantity of tramadol was sent in for pain.  She is provided an Ace wrap for her knee and a sling for the shoulder.  She will follow-up with her primary care and have given her contact information for orthopedics.  I decided against oral prednisone since her diabetes was not really well-controlled, and I am not sure the risks outweigh the benefits for her for that prescription Final Clinical Impressions(s) / UC Diagnoses   Final diagnoses:  Acute pain of right shoulder  Acute pain of right knee     Discharge  Instructions      By my review there are no broken bones on your x-rays.  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  The radiologist will also  read your x-ray, and if their interpretation differs significantly from mine, and the management of your condition would change, we will call you.  Take tramadol 50 mg-- 1 tablet every 6 hours as needed for pain.  This medication can make you sleepy or dizzy  Follow-up with your primary care     ED Prescriptions     Medication Sig Dispense Auth. Provider   traMADol (ULTRAM) 50 MG tablet Take 1 tablet (50 mg total) by mouth every 6 (six) hours as needed (pain). 12 tablet Sheron Tallman K, MD      I have reviewed the PDMP during this encounter.   Ann Keto, MD 12/27/23 [id number]

## 2023-12-27 NOTE — ED Triage Notes (Signed)
 PT reports RT shoulder and RT knee pain .
